# Patient Record
Sex: Male | Born: 1995 | Race: Black or African American | Hispanic: No | Marital: Single | Smoking: Never smoker
Health system: Southern US, Community
[De-identification: ages and names within clinical notes are randomized; demographics above are authoritative.]

---

## 2018-02-05 ENCOUNTER — Other Ambulatory Visit: Payer: Self-pay

## 2018-02-05 ENCOUNTER — Emergency Department (HOSPITAL_COMMUNITY)
Admission: EM | Admit: 2018-02-05 | Discharge: 2018-02-05 | Disposition: A | Discharge status: Home / Self Care | Payer: Self-pay | Attending: Physician Assistant | Admitting: Physician Assistant

## 2018-02-05 ENCOUNTER — Encounter (HOSPITAL_COMMUNITY): Payer: Self-pay

## 2018-02-05 DIAGNOSIS — H109 Unspecified conjunctivitis: Secondary | ICD-10-CM

## 2018-02-05 DIAGNOSIS — H1032 Unspecified acute conjunctivitis, left eye: Secondary | ICD-10-CM | POA: Insufficient documentation

## 2018-02-05 MED ORDER — POLYMYXIN B-TRIMETHOPRIM 10000-0.1 UNIT/ML-% OP SOLN: 1.0000 [drp] | OPHTHALMIC | 0 refills | Status: AC

## 2018-02-05 MED ORDER — POLYMYXIN B-TRIMETHOPRIM 10000-0.1 UNIT/ML-% OP SOLN
2.0000 [drp] | Freq: Once | OPHTHALMIC | Status: DC
  Filled 2018-02-05: qty 10

## 2018-02-05 NOTE — Discharge Instructions (Addendum)
°  Follow with the eye doctor in the next 24 to 28 hours ° °Do not reuse your contact lenses and do not use any contact lenses until you are cleared by the eye doctor. ° ° Wash your hands frequently and try to keep your hands away from the affected eye(s).  ° °You should be feeling some improvement by 48 hours. If symptoms worsen, you develop pain, change in your vision or no improvement in 48 hours please follow with the ophthalmologist or, if that is not possible, return to the emergency room for a recheck. ° ° °

## 2018-02-05 NOTE — ED Provider Notes (Signed)
Patient placed in Quick Look pathway, seen and evaluated   Chief Complaint: eye irritation  HPI:   Ricardo Watts is a 22 y.o. male who presents to the ED with eye itching and redness that started a few days ago and has gotten worse. Patient reports his girlfriend had pink eye and then he got it in his left eye. He reports his eyes itched and he rubbed both of them and now both eye are red and itching.   ROS: Eyes: redness, swelling, itching  Physical Exam:  BP 126/82 (BP Location: Right Arm)   Pulse 81   Temp 98 F (36.7 C) (Oral)   Resp 18   SpO2 100%    Gen: No distress  Neuro: Awake and Alert  Skin: Warm and dry  HEENT: bilateral eye redness left >right     Focused Exam:    Initiation of care has begun. The patient has been counseled on the process, plan, and necessity for staying for the completion/evaluation, and the remainder of the medical screening examination    Janne Napoleoneese, Frederic Tones M, NP 02/05/18 1739    Abelino DerrickMackuen, Courteney Lyn, MD 02/06/18 1929

## 2018-02-05 NOTE — ED Triage Notes (Signed)
Pt reports girl friend was told she had pink eye several days ago and he now has red sclera and drainage.

## 2018-02-05 NOTE — ED Notes (Signed)
ED Provider at bedside. 

## 2018-02-05 NOTE — ED Provider Notes (Signed)
[facility] Provider Note   CSN: [id number] Arrival date & time: 02/05/18  1656     History   Chief Complaint Chief Complaint  Patient presents with  . Conjunctivitis    HPI   Blood pressure 126/82, pulse 81, temperature 98 F (36.7 C), temperature source Oral, resp. rate 18, SpO2 100 %.  Ricardo Watts is a 22 y.o. male complaining of irritation and redness to left eye onset this morning.  He is not a contact lens wearer, there is no trauma, his girlfriend is sick with pinkeye she started treatment yesterday.  He denies any significant decrease in visual acuity, pain, significant discharge she has had some scant matting of the eyelashes when he woke and some clear tearing.  History reviewed. No pertinent past medical history.  There are no active problems to display for this patient.   History reviewed. No pertinent surgical history.      Home Medications    Prior to Admission medications   Medication Sig Start Date End Date Taking? Authorizing Provider  trimethoprim-polymyxin b (POLYTRIM) ophthalmic solution Place 1 drop into the right eye every 4 (four) hours. 02/05/18   Ayla Dunigan, Mardella LaymanNicole, PA-C    Family History No family history on file.  Social History Social History   Tobacco Use  . Smoking status: Never Smoker  . Smokeless tobacco: Never Used  Substance Use Topics  . Alcohol use: Never    Frequency: Never  . Drug use: Never     Allergies   Patient has no known allergies.   Review of Systems Review of Systems  A complete review of systems was obtained and all systems are negative except as noted in the HPI and PMH.   Physical Exam Updated Vital Signs BP 126/82 (BP Location: Right Arm)   Pulse 81   Temp 98 F (36.7 C) (Oral)   Resp 18   SpO2 100%   Physical Exam  Constitutional: He is oriented to person, place, and time. He appears well-developed and well-nourished. No distress.  HENT:    Head: Normocephalic and atraumatic.  Mouth/Throat: Oropharynx is clear and moist.  Eyes: Pupils are equal, round, and reactive to light. EOM are normal.  Left eye injected, pupils equal round and reactive to light  Neck: Normal range of motion.  Cardiovascular: Normal rate, regular rhythm and intact distal pulses.  Pulmonary/Chest: Effort normal and breath sounds normal.  Abdominal: Soft. There is no tenderness.  Musculoskeletal: Normal range of motion.  Neurological: He is alert and oriented to person, place, and time.  Skin: He is not diaphoretic.  Psychiatric: He has a normal mood and affect.  Nursing note and vitals reviewed.    ED Treatments / Results  Labs (all labs ordered are listed, but only abnormal results are displayed) Labs Reviewed - No data to display  EKG None  Radiology No results found.  Procedures Procedures (including critical care time)  Medications Ordered in ED Medications  trimethoprim-polymyxin b (POLYTRIM) ophthalmic solution 2 drop (has no administration in time range)     Initial Impression / Assessment and Plan / ED Course  I have reviewed the triage vital signs and the nursing notes.  Pertinent labs & imaging results that were available during my care of the patient were reviewed by me and considered in my medical decision making (see chart for details).     Vitals:   02/05/18 1727  BP: 126/82  Pulse: 81  Resp: 18  Temp: 98  F (36.7 C)  TempSrc: Oral  SpO2: 100%    Medications  trimethoprim-polymyxin b (POLYTRIM) ophthalmic solution 2 drop (has no administration in time range)    Jerzy Ollis Daudelin is 22 y.o. male presenting with redness and tearing onset this morning, positive sick contact, patient is not a contact lens wearer, patient will be started on Polytrim, advised technique to prevent the spread of infection.  Evaluation does not show pathology that would require ongoing emergent intervention or inpatient  treatment. Pt is hemodynamically stable and mentating appropriately. Discussed findings and plan with patient/guardian, who agrees with care plan. All questions answered. Return precautions discussed and outpatient follow up given.      Final Clinical Impressions(s) / ED Diagnoses   Final diagnoses:  Conjunctivitis of left eye, unspecified conjunctivitis type    ED Discharge Orders        Ordered    trimethoprim-polymyxin b (POLYTRIM) ophthalmic solution  Every 4 hours     02/05/18 1811       Chayil Gantt, Mardella Layman 02/05/18 1815    Mackuen, Cindee Salt, MD 02/06/18 1928

## 2018-02-07 ENCOUNTER — Encounter (HOSPITAL_COMMUNITY): Payer: Self-pay | Admitting: Emergency Medicine

## 2018-02-07 ENCOUNTER — Emergency Department (HOSPITAL_COMMUNITY)
Admission: EM | Admit: 2018-02-07 | Discharge: 2018-02-08 | Disposition: A | Discharge status: Home / Self Care | Payer: Self-pay | Attending: Emergency Medicine | Admitting: Emergency Medicine

## 2018-02-07 ENCOUNTER — Other Ambulatory Visit: Payer: Self-pay

## 2018-02-07 DIAGNOSIS — H1013 Acute atopic conjunctivitis, bilateral: Secondary | ICD-10-CM | POA: Insufficient documentation

## 2018-02-07 NOTE — ED Triage Notes (Signed)
Pt st's he was seen and tx here 2 days ago for pink eye.  Pt st's eyes are not getting any better

## 2018-02-08 MED ORDER — OLOPATADINE HCL 0.1 % OP SOLN
1.0000 [drp] | Freq: Two times a day (BID) | OPHTHALMIC | Status: DC
  Administered 2018-02-08: 1 [drp] via OPHTHALMIC
  Filled 2018-02-08: qty 5

## 2018-02-08 NOTE — ED Provider Notes (Signed)
[facility]   CSN: [id number] Arrival date & time: 02/07/18  2205     History   Chief Complaint Chief Complaint  Patient presents with  . Conjunctivitis    HPI Ricardo Watts is a 22 y.o. male.  Patient returns to the ED for evaluation of worsening bilateral eye swelling and discomfort. He was seen 2 days ago for same and given Polytrim ophthalmic which he states he has been using as directed. His swelling and tearing (clear drainage) has been worse and his eye lids have begun to swell. No nasal/sinus congestion, sore throat. He feels his vision is blurry but attributes that to excessive clear drainage. He does not use contacts. No known contaminants.   The history is provided by the patient. No language interpreter was used.    History reviewed. No pertinent past medical history.  There are no active problems to display for this patient.   History reviewed. No pertinent surgical history.      Home Medications    Prior to Admission medications   Medication Sig Start Date End Date Taking? Authorizing Provider  trimethoprim-polymyxin b (POLYTRIM) ophthalmic solution Place 1 drop into the right eye every 4 (four) hours. 02/05/18   Pisciotta, Mardella LaymanNicole, PA-C    Family History No family history on file.  Social History Social History   Tobacco Use  . Smoking status: Never Smoker  . Smokeless tobacco: Never Used  Substance Use Topics  . Alcohol use: Never    Frequency: Never  . Drug use: Never     Allergies   Patient has no known allergies.   Review of Systems Review of Systems  HENT: Negative.  Negative for congestion, rhinorrhea, sinus pain and sneezing.   Eyes: Positive for pain, discharge, redness, itching and visual disturbance. Negative for photophobia.       See HPI.  Gastrointestinal: Negative for nausea.  Allergic/Immunologic: Negative for environmental allergies.  Neurological: Negative  for headaches.     Physical Exam Updated Vital Signs BP 115/73 (BP Location: Right Arm)   Pulse (!) 57   Temp 97.8 F (36.6 C) (Oral)   Resp 18   Ht 6\' 3"  (1.905 m)   Wt 83.9 kg (185 lb)   SpO2 99%   BMI 23.12 kg/m   Physical Exam  Constitutional: He is oriented to person, place, and time. He appears well-developed and well-nourished.  Eyes:  Bilateral eye lid swelling of upper > lower lids without significant erythema. No periorbital or facial swelling. There is significant conjunctival erythema and swelling with chemosis. Cornea are clear. PERRL. No foreign body visualized. There is clear drainage without purulence.  Neck: Normal range of motion.  Pulmonary/Chest: Effort normal.  Musculoskeletal: Normal range of motion.  Neurological: He is alert and oriented to person, place, and time.  Skin: Skin is warm and dry.  Psychiatric: He has a normal mood and affect.     ED Treatments / Results  Labs (all labs ordered are listed, but only abnormal results are displayed) Labs Reviewed - No data to display  EKG None  Radiology No results found.  Procedures Procedures (including critical care time)  Medications Ordered in ED Medications  olopatadine (PATANOL) 0.1 % ophthalmic solution 1 drop (has no administration in time range)     Initial Impression / Assessment and Plan / ED Course  I have reviewed the triage vital signs and the nursing notes.  Pertinent labs & imaging results that were available  during my care of the patient were reviewed by me and considered in my medical decision making (see chart for details).     Patient returns to the ED with worsening symptoms of conjunctival swelling and discomfort. Has been using abx drops without improvement.   He has significant chemosis supporting allergic cause of symptoms. Will provide Patanol for daily use and strongly encourage follow up with ophthalmology for recheck to insure improvement/resolution.  Final  Clinical Impressions(s) / ED Diagnoses   Final diagnoses:  None   1. Allergic conjunctivitis  ED Discharge Orders    None       Elpidio Anis, PA-C 02/08/18 5366    Ward, Layla Maw, DO 02/08/18 4403

## 2018-02-08 NOTE — Discharge Instructions (Addendum)
Use Patanol twice daily. Apply cool compresses to the eye to provide comfort and reduce swelling. Also recommend Artificial Tears for further symptomatic relief. Keep this in the refrigerator as the cooler temperature will be beneficial. Call Dr. Charlaine DaltonMarchese on Monday to schedule an appointment for recheck to insure symptoms are improving.

## 2020-03-30 ENCOUNTER — Emergency Department (HOSPITAL_COMMUNITY): Payer: Self-pay

## 2020-03-30 ENCOUNTER — Other Ambulatory Visit: Payer: Self-pay

## 2020-03-30 ENCOUNTER — Emergency Department (HOSPITAL_COMMUNITY)
Admission: EM | Admit: 2020-03-30 | Discharge: 2020-03-31 | Disposition: A | Discharge status: Home / Self Care | Payer: Self-pay | Attending: Emergency Medicine | Admitting: Emergency Medicine

## 2020-03-30 DIAGNOSIS — R197 Diarrhea, unspecified: Secondary | ICD-10-CM | POA: Insufficient documentation

## 2020-03-30 DIAGNOSIS — R531 Weakness: Secondary | ICD-10-CM | POA: Insufficient documentation

## 2020-03-30 DIAGNOSIS — Z20822 Contact with and (suspected) exposure to covid-19: Secondary | ICD-10-CM | POA: Insufficient documentation

## 2020-03-30 DIAGNOSIS — R519 Headache, unspecified: Secondary | ICD-10-CM | POA: Insufficient documentation

## 2020-03-30 DIAGNOSIS — R05 Cough: Secondary | ICD-10-CM | POA: Insufficient documentation

## 2020-03-30 DIAGNOSIS — M7918 Myalgia, other site: Secondary | ICD-10-CM | POA: Insufficient documentation

## 2020-03-30 DIAGNOSIS — R110 Nausea: Secondary | ICD-10-CM | POA: Insufficient documentation

## 2020-03-30 DIAGNOSIS — R509 Fever, unspecified: Secondary | ICD-10-CM | POA: Insufficient documentation

## 2020-03-30 LAB — CBC WITH DIFFERENTIAL/PLATELET
Abs Immature Granulocytes: 0 10*3/uL (ref 0.00–0.07)
Basophils Absolute: 0 10*3/uL (ref 0.0–0.1)
Basophils Relative: 1 %
Eosinophils Absolute: 0 10*3/uL (ref 0.0–0.5)
Eosinophils Relative: 1 %
HCT: 41.4 % (ref 39.0–52.0)
Hemoglobin: 14.8 g/dL (ref 13.0–17.0)
Lymphocytes Relative: 44 %
Lymphs Abs: 1.3 10*3/uL (ref 0.7–4.0)
MCH: 29 pg (ref 26.0–34.0)
MCHC: 35.7 g/dL (ref 30.0–36.0)
MCV: 81 fL (ref 80.0–100.0)
Monocytes Absolute: 0.3 10*3/uL (ref 0.1–1.0)
Monocytes Relative: 10 %
Neutro Abs: 1.3 10*3/uL — ABNORMAL LOW (ref 1.7–7.7)
Neutrophils Relative %: 44 %
Platelets: 128 10*3/uL — ABNORMAL LOW (ref 150–400)
RBC: 5.11 MIL/uL (ref 4.22–5.81)
RDW: 13.2 % (ref 11.5–15.5)
WBC: 3 10*3/uL — ABNORMAL LOW (ref 4.0–10.5)
nRBC: 0 % (ref 0.0–0.2)
nRBC: 0 /100 WBC

## 2020-03-30 LAB — URINALYSIS, ROUTINE W REFLEX MICROSCOPIC
Bacteria, UA: NONE SEEN
Bilirubin Urine: NEGATIVE
Glucose, UA: NEGATIVE mg/dL
Ketones, ur: NEGATIVE mg/dL
Leukocytes,Ua: NEGATIVE
Nitrite: NEGATIVE
Protein, ur: NEGATIVE mg/dL
Specific Gravity, Urine: 1.013 (ref 1.005–1.030)
pH: 7 (ref 5.0–8.0)

## 2020-03-30 LAB — COMPREHENSIVE METABOLIC PANEL
ALT: 223 U/L — ABNORMAL HIGH (ref 0–44)
AST: 256 U/L — ABNORMAL HIGH (ref 15–41)
Albumin: 3.7 g/dL (ref 3.5–5.0)
Alkaline Phosphatase: 126 U/L (ref 38–126)
Anion gap: 11 (ref 5–15)
BUN: 8 mg/dL (ref 6–20)
CO2: 27 mmol/L (ref 22–32)
Calcium: 8.7 mg/dL — ABNORMAL LOW (ref 8.9–10.3)
Chloride: 98 mmol/L (ref 98–111)
Creatinine, Ser: 1.07 mg/dL (ref 0.61–1.24)
GFR calc Af Amer: >60 mL/min (ref >60)
GFR calc non Af Amer: >60 mL/min (ref >60)
Glucose, Bld: 101 mg/dL — ABNORMAL HIGH (ref 70–99)
Potassium: 4.2 mmol/L (ref 3.5–5.1)
Sodium: 136 mmol/L (ref 135–145)
Total Bilirubin: 1.9 mg/dL — ABNORMAL HIGH (ref 0.3–1.2)
Total Protein: 7.1 g/dL (ref 6.5–8.1)

## 2020-03-30 LAB — LACTIC ACID, PLASMA: Lactic Acid, Venous: 1.4 mmol/L (ref 0.5–1.9)

## 2020-03-30 NOTE — ED Triage Notes (Signed)
Patient arrives with c/o of intermittent fever, nausea, and muscle aches x4 days. Per the patient, his fever reached a high of 103 on Monday, causing him to be out of work. He had a covid test this week due to his symptoms that was negative. Patient has used tylenol and dramamine at home with no relief.

## 2020-03-31 ENCOUNTER — Emergency Department (HOSPITAL_COMMUNITY): Payer: Self-pay

## 2020-03-31 LAB — SARS CORONAVIRUS 2 BY RT PCR (HOSPITAL ORDER, PERFORMED IN ~~LOC~~ HOSPITAL LAB): SARS Coronavirus 2: NEGATIVE

## 2020-03-31 LAB — LACTIC ACID, PLASMA: Lactic Acid, Venous: 2.3 mmol/L (ref 0.5–1.9)

## 2020-03-31 MED ORDER — BENZONATATE 100 MG PO CAPS: 100.0000 mg | ORAL_CAPSULE | Freq: Three times a day (TID) | ORAL | 0 refills | Status: DC | PRN

## 2020-03-31 MED ORDER — ONDANSETRON HCL 4 MG PO TABS: 4.0000 mg | ORAL_TABLET | Freq: Three times a day (TID) | ORAL | 0 refills | Status: AC | PRN

## 2020-03-31 MED ORDER — SODIUM CHLORIDE 0.9 % IV BOLUS
500.0000 mL | Freq: Once | INTRAVENOUS | Status: AC
  Administered 2020-03-31: 500 mL via INTRAVENOUS

## 2020-03-31 MED ORDER — ONDANSETRON HCL 4 MG/2ML IJ SOLN
4.0000 mg | Freq: Once | INTRAMUSCULAR | Status: AC
  Administered 2020-03-31: 4 mg via INTRAVENOUS
  Filled 2020-03-31: qty 2

## 2020-03-31 MED ORDER — ONDANSETRON HCL 4 MG PO TABS: 4.0000 mg | ORAL_TABLET | Freq: Three times a day (TID) | ORAL | 0 refills | Status: DC | PRN

## 2020-03-31 MED ORDER — BENZONATATE 100 MG PO CAPS: 100.0000 mg | ORAL_CAPSULE | Freq: Three times a day (TID) | ORAL | 0 refills | Status: AC | PRN

## 2020-03-31 MED ORDER — IBUPROFEN 800 MG PO TABS
800.0000 mg | ORAL_TABLET | Freq: Once | ORAL | Status: AC
  Administered 2020-03-31: 800 mg via ORAL
  Filled 2020-03-31: qty 1

## 2020-03-31 NOTE — ED Provider Notes (Signed)
[facility] EMERGENCY DEPARTMENT Provider Note   CSN: [id number] Arrival date & time: 03/30/20  1826     History Chief Complaint  Patient presents with  . Fever  . Weakness    Ricardo Watts is a 24 y.o. male with a hx of no major medical problems presents to the Emergency Department complaining of gradual, persistent, progressively worsening fever and chills onset 4 days ago.  Patient reports associated headache, body aches, nausea and diarrhea.  Patient reports he has been drinking fluids but continues to feel somewhat dehydrated.  Reports intermittent cough but no shortness of breath or chest pain.  No known Covid contacts.  Patient has not been vaccinated.  No treatments prior to arrival.  Patient found to be febrile on arrival.  The history is provided by the patient and medical records. No language interpreter was used.       No past medical history on file.  There are no problems to display for this patient.   No past surgical history on file.     No family history on file.  Social History   Tobacco Use  . Smoking status: Never Smoker  . Smokeless tobacco: Never Used  Substance Use Topics  . Alcohol use: Never  . Drug use: Never    Home Medications Prior to Admission medications   Medication Sig Start Date End Date Taking? Authorizing Provider  benzonatate (TESSALON PERLES) 100 MG capsule Take 1 capsule (100 mg total) by mouth 3 (three) times daily as needed for cough (cough). 03/31/20   Olia Hinderliter, Dahlia Client, PA-C  ondansetron (ZOFRAN) 4 MG tablet Take 1 tablet (4 mg total) by mouth every 8 (eight) hours as needed for nausea or vomiting. 03/31/20   Shawndrea Rutkowski, Dahlia Client, PA-C  trimethoprim-polymyxin b (POLYTRIM) ophthalmic solution Place 1 drop into the right eye every 4 (four) hours. 02/05/18   Pisciotta, Joni Reining, PA-C    Allergies    Patient has no known allergies.  Review of Systems   Review of Systems  Constitutional: Positive  for chills, fatigue and fever. Negative for appetite change, diaphoresis and unexpected weight change.  HENT: Positive for congestion. Negative for mouth sores.   Eyes: Negative for visual disturbance.  Respiratory: Positive for cough. Negative for chest tightness, shortness of breath and wheezing.   Cardiovascular: Negative for chest pain.  Gastrointestinal: Positive for diarrhea and nausea. Negative for abdominal pain, constipation and vomiting.  Endocrine: Negative for polydipsia, polyphagia and polyuria.  Genitourinary: Negative for dysuria, frequency, hematuria and urgency.  Musculoskeletal: Negative for back pain and neck stiffness.  Skin: Negative for rash.  Allergic/Immunologic: Negative for immunocompromised state.  Neurological: Positive for weakness and headaches. Negative for syncope and light-headedness.  Hematological: Does not bruise/bleed easily.  Psychiatric/Behavioral: Negative for sleep disturbance. The patient is not nervous/anxious.     Physical Exam Updated Vital Signs BP 124/85 (BP Location: Right Arm)   Pulse 85   Temp 99.6 F (37.6 C) (Oral)   Resp 18   Ht 6\' 5"  (1.956 m)   Wt 86.2 kg   SpO2 100%   BMI 22.53 kg/m   Physical Exam Vitals and nursing note reviewed.  Constitutional:      General: He is not in acute distress.    Appearance: He is ill-appearing. He is not diaphoretic.  HENT:     Head: Normocephalic.     Nose: Congestion present.     Mouth/Throat:     Mouth: Mucous membranes are moist.  Eyes:  General: No scleral icterus.    Conjunctiva/sclera: Conjunctivae normal.  Cardiovascular:     Rate and Rhythm: Normal rate and regular rhythm.     Pulses: Normal pulses.          Radial pulses are 2+ on the right side and 2+ on the left side.  Pulmonary:     Effort: No tachypnea, accessory muscle usage, prolonged expiration, respiratory distress or retractions.     Breath sounds: Normal breath sounds. No stridor. No wheezing or rhonchi.      Comments: Equal chest rise. No increased work of breathing. Abdominal:     General: There is no distension.     Palpations: Abdomen is soft.     Tenderness: There is no abdominal tenderness. There is no guarding or rebound.  Musculoskeletal:     Cervical back: Normal range of motion.     Comments: Moves all extremities equally and without difficulty.  Skin:    General: Skin is warm and dry.     Capillary Refill: Capillary refill takes less than 2 seconds.  Neurological:     Mental Status: He is alert.     GCS: GCS eye subscore is 4. GCS verbal subscore is 5. GCS motor subscore is 6.     Comments: Speech is clear and goal oriented.  Psychiatric:        Mood and Affect: Mood normal.     ED Results / Procedures / Treatments   Labs (all labs ordered are listed, but only abnormal results are displayed) Labs Reviewed  LACTIC ACID, PLASMA - Abnormal; Notable for the following components:      Result Value   Lactic Acid, Venous 2.3 (*)    All other components within normal limits  COMPREHENSIVE METABOLIC PANEL - Abnormal; Notable for the following components:   Glucose, Bld 101 (*)    Calcium 8.7 (*)    AST 256 (*)    ALT 223 (*)    Total Bilirubin 1.9 (*)    All other components within normal limits  CBC WITH DIFFERENTIAL/PLATELET - Abnormal; Notable for the following components:   WBC 3.0 (*)    Platelets 128 (*)    Neutro Abs 1.3 (*)    All other components within normal limits  URINALYSIS, ROUTINE W REFLEX MICROSCOPIC - Abnormal; Notable for the following components:   Color, Urine AMBER (*)    Hgb urine dipstick SMALL (*)    All other components within normal limits  SARS CORONAVIRUS 2 BY RT PCR (HOSPITAL ORDER, PERFORMED IN [facility])  LACTIC ACID, PLASMA  POC SARS CORONAVIRUS 2 AG -  ED    Radiology DG Chest 2 View  Result Date: 03/30/2020 CLINICAL DATA:  Muscle aches and weakness. EXAM: CHEST - 2 VIEW COMPARISON:  None. FINDINGS: The heart size and  mediastinal contours are within normal limits. Both lungs are clear. The visualized skeletal structures are unremarkable. IMPRESSION: No active cardiopulmonary disease. Electronically Signed   By: Katherine Mantle M.D.   On: 03/30/2020 20:16   US Abdomen Limited  Result Date: 03/31/2020 CLINICAL DATA:  Right upper quadrant abdominal pain. EXAM: ULTRASOUND ABDOMEN LIMITED RIGHT UPPER QUADRANT COMPARISON:  None. FINDINGS: Gallbladder: No gallstones or wall thickening visualized. No sonographic Murphy sign noted by sonographer. Common bile duct: Diameter: 3 mm. Liver: No focal lesion identified. Within normal limits in parenchymal echogenicity. Portal vein is patent on color Doppler imaging with normal direction of blood flow towards the liver. Other: None. IMPRESSION: Normal  study. Electronically Signed   By: Constance Holster M.D.   On: 03/31/2020 00:45    Procedures Procedures (including critical care time)  Medications Ordered in ED Medications  sodium chloride 0.9 % bolus 500 mL (0 mLs Intravenous Stopped 03/31/20 0351)  ibuprofen (ADVIL) tablet 800 mg (800 mg Oral Given 03/31/20 0100)  ondansetron (ZOFRAN) injection 4 mg (4 mg Intravenous Given 03/31/20 0054)    ED Course  I have reviewed the triage vital signs and the nursing notes.  Pertinent labs & imaging results that were available during my care of the patient were reviewed by me and considered in my medical decision making (see chart for details).  Clinical Course as of Mar 31 401  Fri Mar 31, 2020  0401 Elevated.  Fluids given.  Lactic Acid, Venous(!!): 2.3 [HM]  0401 Noted.  No large amount of Tylenol ingestion.  Suspect Covid hepatitis.  AST(!): 256 [HM]    Clinical Course User Index [HM] Terris Bodin, Gwenlyn Perking   MDM Rules/Calculators/A&P                       Lowella Dandy Quanah Majka was evaluated in Emergency Department on 03/31/2020 for the symptoms described in the history of present illness. He was evaluated in  the context of the global COVID-19 pandemic, which necessitated consideration that the patient might be at risk for infection with the SARS-CoV-2 virus that causes COVID-19. Institutional protocols and algorithms that pertain to the evaluation of patients at risk for COVID-19 are in a state of rapid change based on information released by regulatory bodies including the CDC and federal and state organizations. These policies and algorithms were followed during the patient's care in the ED.   Patient presents with Covid-like symptoms.  Covid test pending. Labs consistent with COVID, including elevation of LFTs and leukopenia.  Abdomen is soft and nontender.  Will ultrasound gallbladder, give fluids and reassess.  4:00 AM Patient reports he is feeling better.  No tachycardia, hypotension or fever here in the emergency department.  Labs reviewed.  He does have elevation in LFTs however ultrasound shows no evidence of cholecystitis or choledocholithiasis.  Patient reports minimal Tylenol ingestion in the last 2 weeks (no more than 2 extra strength tablets per day), well under the toxic limit.  Given lab abnormalities and symptoms patient clinically has COVID-19.  Will treat symptomatically.  Discussed lab abnormalities with patient and mother on the phone.  Recommend close follow-up.  Also discussed reasons to return immediately to the emergency department.  Patient states understanding and is in agreement with the plan.    Final Clinical Impression(s) / ED Diagnoses Final diagnoses:  Suspected COVID-19 virus infection    Rx / DC Orders ED Discharge Orders         Ordered    benzonatate (TESSALON PERLES) 100 MG capsule  3 times daily PRN,   Status:  Discontinued     03/31/20 0341    ondansetron (ZOFRAN) 4 MG tablet  Every 8 hours PRN,   Status:  Discontinued     03/31/20 0341    ondansetron (ZOFRAN) 4 MG tablet  Every 8 hours PRN     03/31/20 0356    benzonatate (TESSALON PERLES) 100 MG capsule   3 times daily PRN     03/31/20 0356           Senetra Dillin, Gwenlyn Perking 03/31/20 0402    Palumbo, April, MD 03/31/20 5093

## 2020-03-31 NOTE — Discharge Instructions (Signed)
1. Medications: Alternate tylenol and ibuprofen for fever control, P.o. for coughing, Zofran for nausea and diarrhea, continue usual home medications 2. Treatment: rest, drink plenty of fluids, isolate for the next 10 days 3. Follow Up: Please followup with your primary doctor if your symptoms are not improving after 10-14 days; Please return to the ER for high fevers, persistent vomiting, shortness of breath or other concerns.

## 2021-02-12 ENCOUNTER — Other Ambulatory Visit: Payer: Self-pay

## 2021-02-12 ENCOUNTER — Emergency Department (HOSPITAL_COMMUNITY)
Admission: EM | Admit: 2021-02-12 | Discharge: 2021-02-12 | Disposition: A | Discharge status: Home / Self Care | Payer: Worker's Compensation | Attending: Emergency Medicine | Admitting: Emergency Medicine

## 2021-02-12 ENCOUNTER — Emergency Department (HOSPITAL_COMMUNITY): Payer: Worker's Compensation

## 2021-02-12 DIAGNOSIS — Y990 Civilian activity done for income or pay: Secondary | ICD-10-CM | POA: Diagnosis not present

## 2021-02-12 DIAGNOSIS — X500XXA Overexertion from strenuous movement or load, initial encounter: Secondary | ICD-10-CM | POA: Insufficient documentation

## 2021-02-12 DIAGNOSIS — S4992XA Unspecified injury of left shoulder and upper arm, initial encounter: Secondary | ICD-10-CM | POA: Diagnosis present

## 2021-02-12 DIAGNOSIS — M62838 Other muscle spasm: Secondary | ICD-10-CM

## 2021-02-12 DIAGNOSIS — M25512 Pain in left shoulder: Secondary | ICD-10-CM

## 2021-02-12 MED ORDER — NAPROXEN 250 MG PO TABS
500.0000 mg | ORAL_TABLET | Freq: Once | ORAL | Status: AC
  Administered 2021-02-12: 500 mg via ORAL
  Filled 2021-02-12: qty 2

## 2021-02-12 MED ORDER — NAPROXEN 500 MG PO TABS: 500.0000 mg | ORAL_TABLET | Freq: Two times a day (BID) | ORAL | 0 refills | Status: AC

## 2021-02-12 MED ORDER — CYCLOBENZAPRINE HCL 10 MG PO TABS: 10.0000 mg | ORAL_TABLET | Freq: Two times a day (BID) | ORAL | 0 refills | Status: AC | PRN

## 2021-02-12 MED ORDER — CYCLOBENZAPRINE HCL 10 MG PO TABS
5.0000 mg | ORAL_TABLET | Freq: Once | ORAL | Status: AC
  Administered 2021-02-12: 5 mg via ORAL
  Filled 2021-02-12: qty 1

## 2021-02-12 NOTE — ED Provider Notes (Signed)
[facility] EMERGENCY DEPARTMENT Provider Note   CSN: [id number] Arrival date & time: 02/12/21  1755       History Chief Complaint  Patient presents with  . Shoulder Injury    Ricardo Watts is a 25 y.o. male.  HPI    This is a 25 year old male with no reported past medical history who presents with a shoulder injury.  Patient reports that he was at work when he lifted a carpet and placed onto his left shoulder.  He states that he heard a pop.  He has had pain in the posterior shoulder since that time.  Pain is worse with range of motion.  He rates his pain 8 out of 10.  He has not taken anything for his pain.  Denies numbness or tingling in the arm.  Patient reports that he is able to range his arm but it is quite painful for him.  Denies other injury. No past medical history on file.  There are no problems to display for this patient.   No past surgical history on file.     No family history on file.  Social History   Tobacco Use  . Smoking status: Never Smoker  . Smokeless tobacco: Never Used  Substance Use Topics  . Alcohol use: Never  . Drug use: Never    Home Medications Prior to Admission medications   Medication Sig Start Date End Date Taking? Authorizing Provider  cyclobenzaprine (FLEXERIL) 10 MG tablet Take 1 tablet (10 mg total) by mouth 2 (two) times daily as needed for muscle spasms. 02/12/21  Yes Tyberius Ryner, Mayer Masker, MD  naproxen (NAPROSYN) 500 MG tablet Take 1 tablet (500 mg total) by mouth 2 (two) times daily. 02/12/21  Yes Dimitra Woodstock, Mayer Masker, MD  benzonatate (TESSALON PERLES) 100 MG capsule Take 1 capsule (100 mg total) by mouth 3 (three) times daily as needed for cough (cough). 03/31/20   Muthersbaugh, Dahlia Client, PA-C  ondansetron (ZOFRAN) 4 MG tablet Take 1 tablet (4 mg total) by mouth every 8 (eight) hours as needed for nausea or vomiting. 03/31/20   Muthersbaugh, Dahlia Client, PA-C  trimethoprim-polymyxin b (POLYTRIM) ophthalmic solution  Place 1 drop into the right eye every 4 (four) hours. 02/05/18   Pisciotta, Joni Reining, PA-C    Allergies    Patient has no known allergies.  Review of Systems   Review of Systems  Constitutional: Negative for fever.  Musculoskeletal:       Total pain  Neurological: Negative for weakness and numbness.  All other systems reviewed and are negative.   Physical Exam Updated Vital Signs BP 123/80 (BP Location: Right Arm)   Pulse 74   Temp 98.4 F (36.9 C) (Oral)   Resp 20   SpO2 100%   Physical Exam Vitals and nursing note reviewed.  Constitutional:      Appearance: He is well-developed. He is not ill-appearing.  HENT:     Head: Normocephalic and atraumatic.     Mouth/Throat:     Mouth: Mucous membranes are moist.  Eyes:     Pupils: Pupils are equal, round, and reactive to light.  Cardiovascular:     Rate and Rhythm: Normal rate and regular rhythm.  Pulmonary:     Effort: Pulmonary effort is normal. No respiratory distress.  Abdominal:     Palpations: Abdomen is soft.     Tenderness: There is no abdominal tenderness.  Musculoskeletal:     Cervical back: Neck supple.     Comments: Tenderness  to palpation over the posterior musculature of the left shoulder along the trap, slight spasm noted, normal range of motion of the joint, no clavicular or AC joint tenderness or deformity, 2+ radial pulse, strength appears grossly intact with abduction, external and internal rotation of the shoulder  Lymphadenopathy:     Cervical: No cervical adenopathy.  Skin:    General: Skin is warm and dry.  Neurological:     Mental Status: He is alert and oriented to person, place, and time.  Psychiatric:        Mood and Affect: Mood normal.     ED Results / Procedures / Treatments   Labs (all labs ordered are listed, but only abnormal results are displayed) Labs Reviewed - No data to display  EKG None  Radiology DG Shoulder Left  Result Date: 02/12/2021 CLINICAL DATA:  25 year old male  with trauma to the left shoulder. EXAM: LEFT SHOULDER - 2+ VIEW COMPARISON:  None. FINDINGS: There is no evidence of fracture or dislocation. There is no evidence of arthropathy or other focal bone abnormality. Soft tissues are unremarkable. IMPRESSION: Negative. Electronically Signed   By: Elgie Collard M.D.   On: 02/12/2021 18:39    Procedures Procedures   Medications Ordered in ED Medications  cyclobenzaprine (FLEXERIL) tablet 5 mg (has no administration in time range)  naproxen (NAPROSYN) tablet 500 mg (has no administration in time range)    ED Course  I have reviewed the triage vital signs and the nursing notes.  Pertinent labs & imaging results that were available during my care of the patient were reviewed by me and considered in my medical decision making (see chart for details).    MDM Rules/Calculators/A&P                          Patient presents with left shoulder pain.  He is overall nontoxic vital signs are reassuring.  X-ray reviewed from triage and is negative for acute fracture or dislocation.  Pain is actually the posterior portion of the shoulder along the musculature and trapezius.  Strength is intact.  Suspect muscle spasm or strain.  Rotator cuff injury is a consideration although mechanism would not necessarily be consistent.  Recommend ice, anti-inflammatories, avoiding heavy lifting until improved.  He will be given orthopedic follow-up.  Patient is agreeable plan.  After history, exam, and medical workup I feel the patient has been appropriately medically screened and is safe for discharge home. Pertinent diagnoses were discussed with the patient. Patient was given return precautions.  Final Clinical Impression(s) / ED Diagnoses Final diagnoses:  Acute pain of left shoulder  Muscle spasm    Rx / DC Orders ED Discharge Orders         Ordered    cyclobenzaprine (FLEXERIL) 10 MG tablet  2 times daily PRN        02/12/21 2334    naproxen (NAPROSYN) 500 MG  tablet  2 times daily        02/12/21 2334           Shon Baton, MD 02/12/21 2339

## 2021-02-12 NOTE — ED Triage Notes (Signed)
Pt from work for eval of shoulder injury-set a rug to be cleaned on his L shoulder, heard a pop, and now has no mobility of that joint.

## 2021-02-12 NOTE — ED Triage Notes (Signed)
Emergency Medicine Provider Triage Evaluation Note  Jaxtin Raimondo , a 25 y.o. male  was evaluated in triage.  Pt complains of left shoulder pain.  He states that he was at work picking up a rug when he rested it on his left shoulder and heard a pop.  He states that he has decreased subjective sensation in the left upper arm.  He denies true weakness however notes he has significant pain when he tries to move his left shoulder..    Physical Exam  BP 117/83 (BP Location: Right Arm)   Pulse 66   Temp 98.6 F (37 C) (Oral)   Resp 14   SpO2 99%  Patient is awake and alert, no obvious distress.  Speech is not slurred and answers questions appropriately. Mild pain with any attempts at movement of the left shoulder.  He is able to move his left hand and fingers. Sensation to left upper extremity is grossly intact on exam to light touch.  Medical Decision Making  Medically screening exam initiated at 6:08 PM.  Appropriate orders placed.  Rene Kocher was informed that the remainder of the evaluation will be completed by another provider, this initial triage assessment does not replace that evaluation, and the importance of remaining in the ED until their evaluation is complete.  Clinical Impression  Left shoulder pain   Cristina Gong, [place] 02/12/21 1810

## 2021-02-12 NOTE — Discharge Instructions (Addendum)
You were seen today for shoulder pain.  Your x-rays do not show fracture.  You likely have some muscle spasm and strain.  Use ice.  Take medications as prescribed.  Follow-up with orthopedics if not improving.  Avoid heavy lifting until pain has improved.

## 2022-07-14 IMAGING — CR DG SHOULDER 2+V*L*
4 series · 4 of 4 positions shown · non-contrast
Comparison: None.

CLINICAL DATA: 24-year-old male with trauma to the left shoulder.

EXAM:
LEFT SHOULDER - 2+ VIEW

[shoulder grashey]
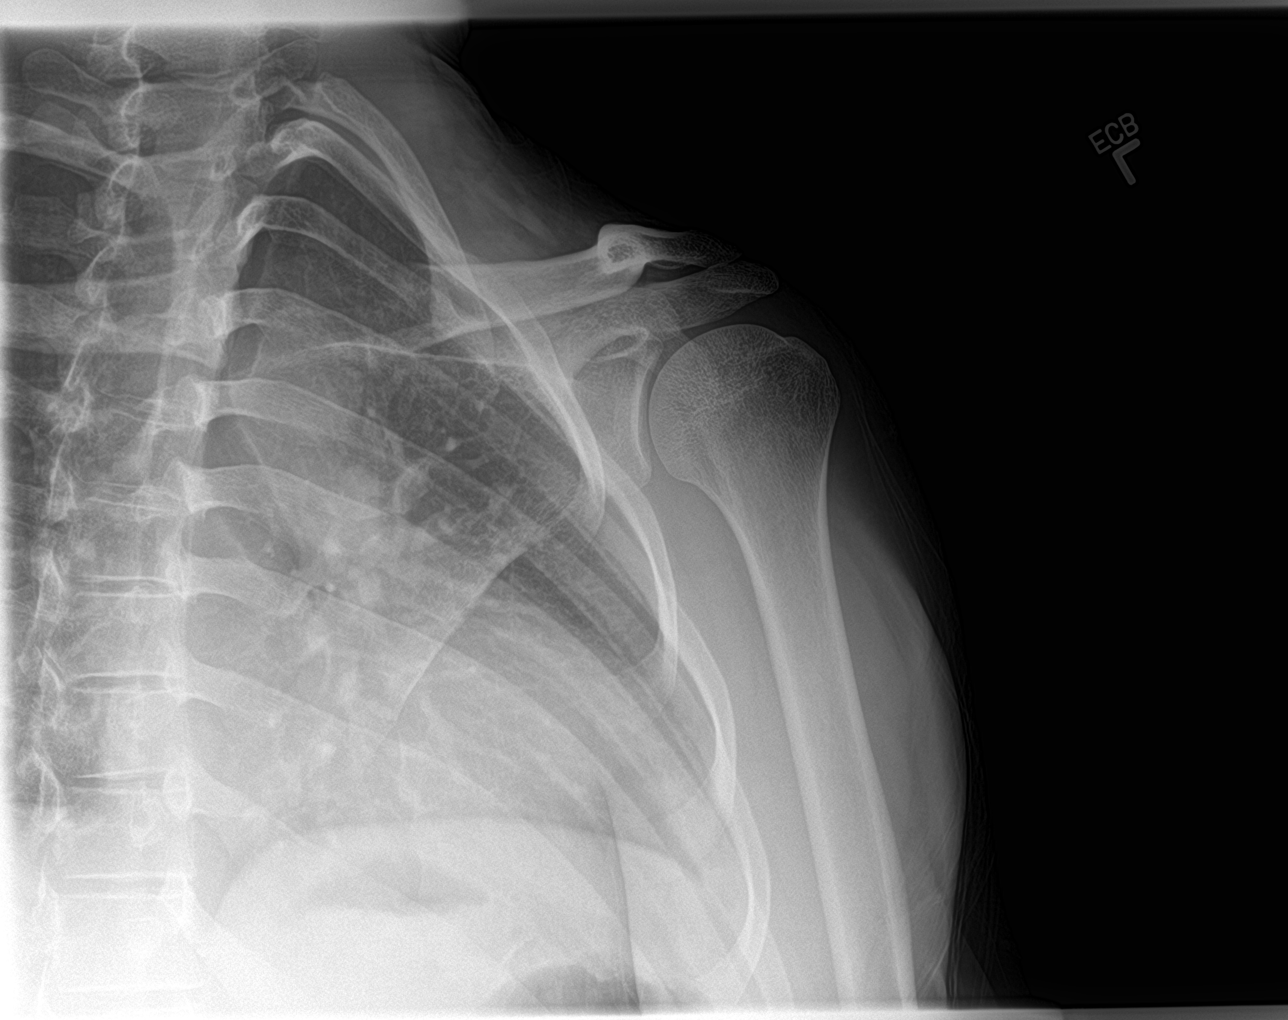

[shoulder y view]
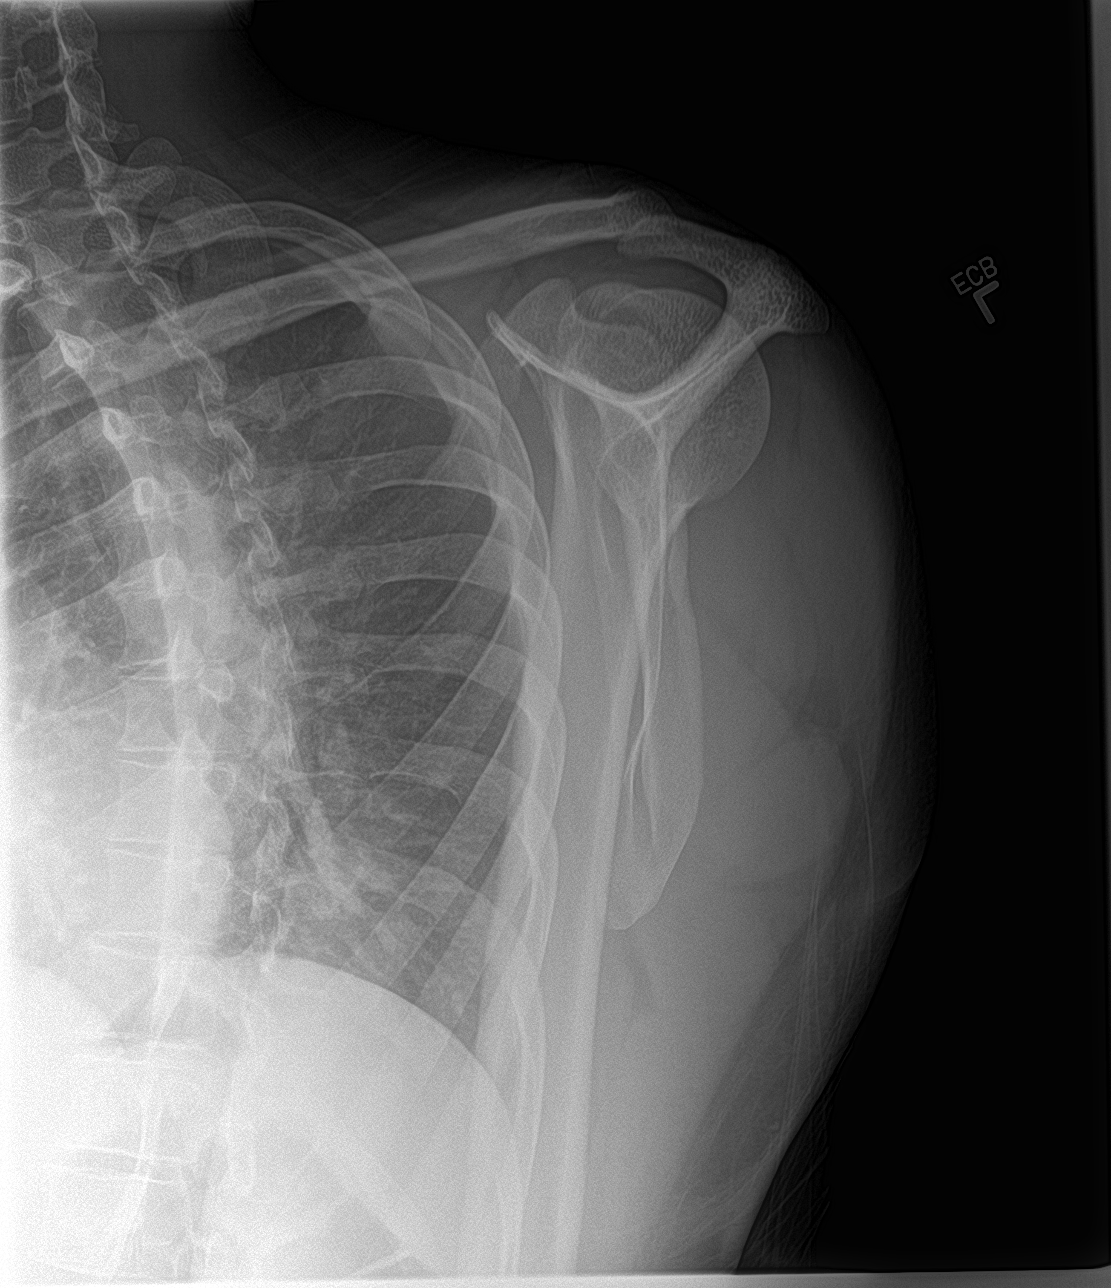

[shoulder axillary]
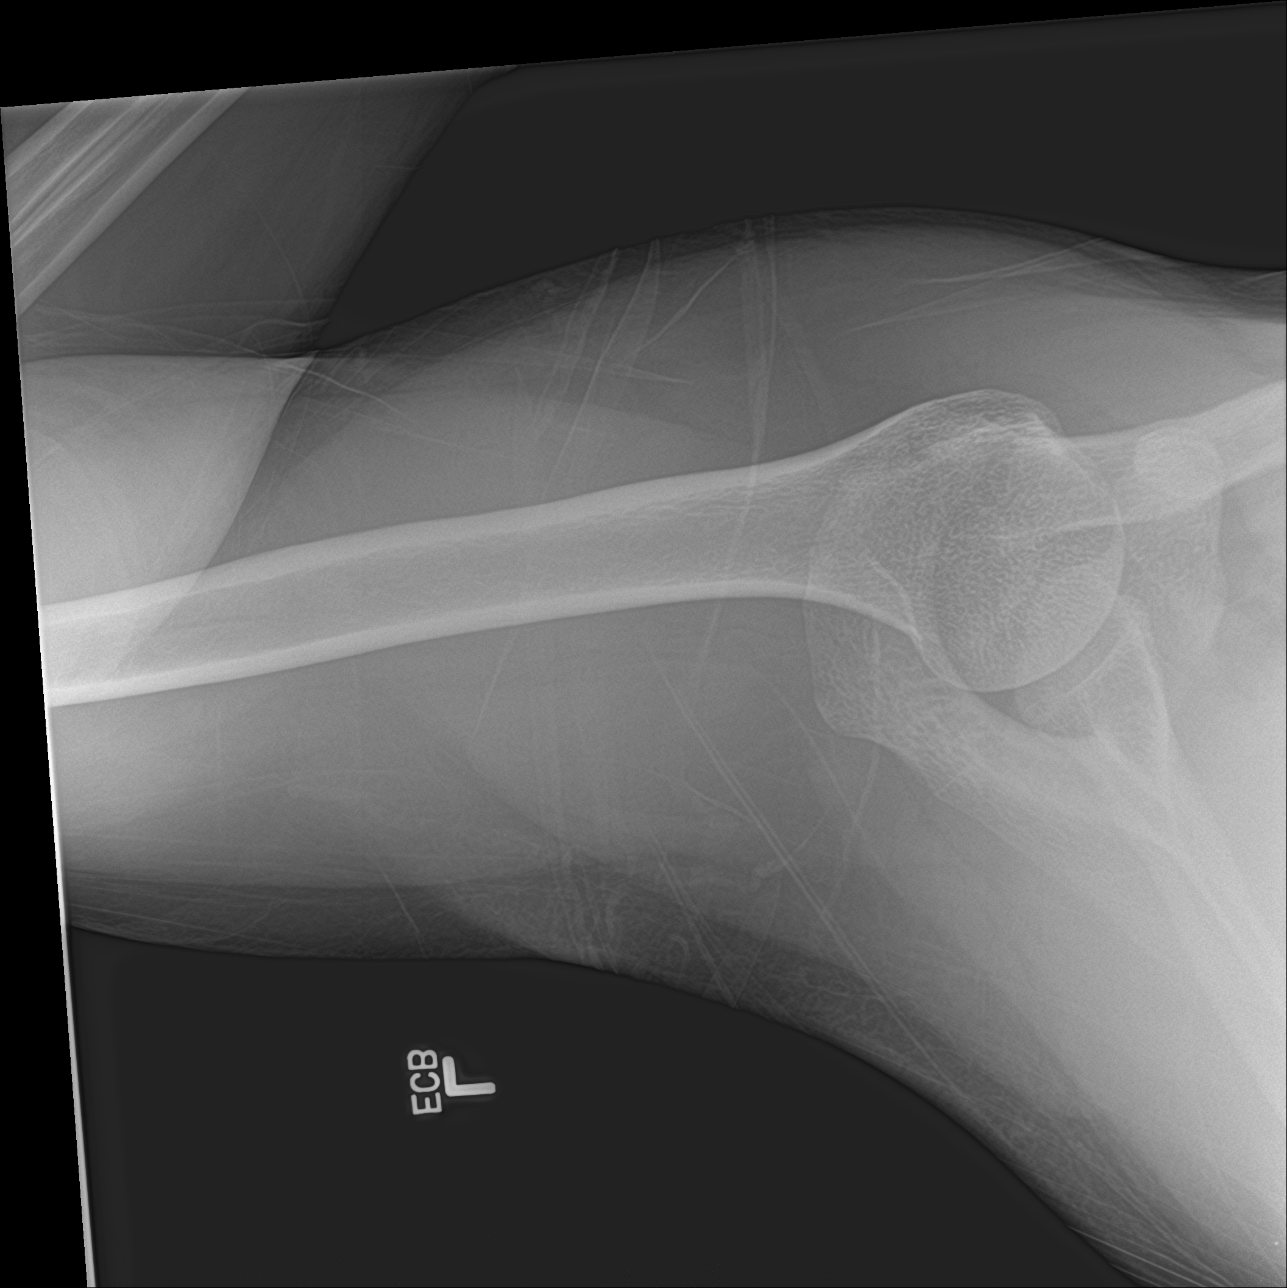

[shoulder ap neutral]
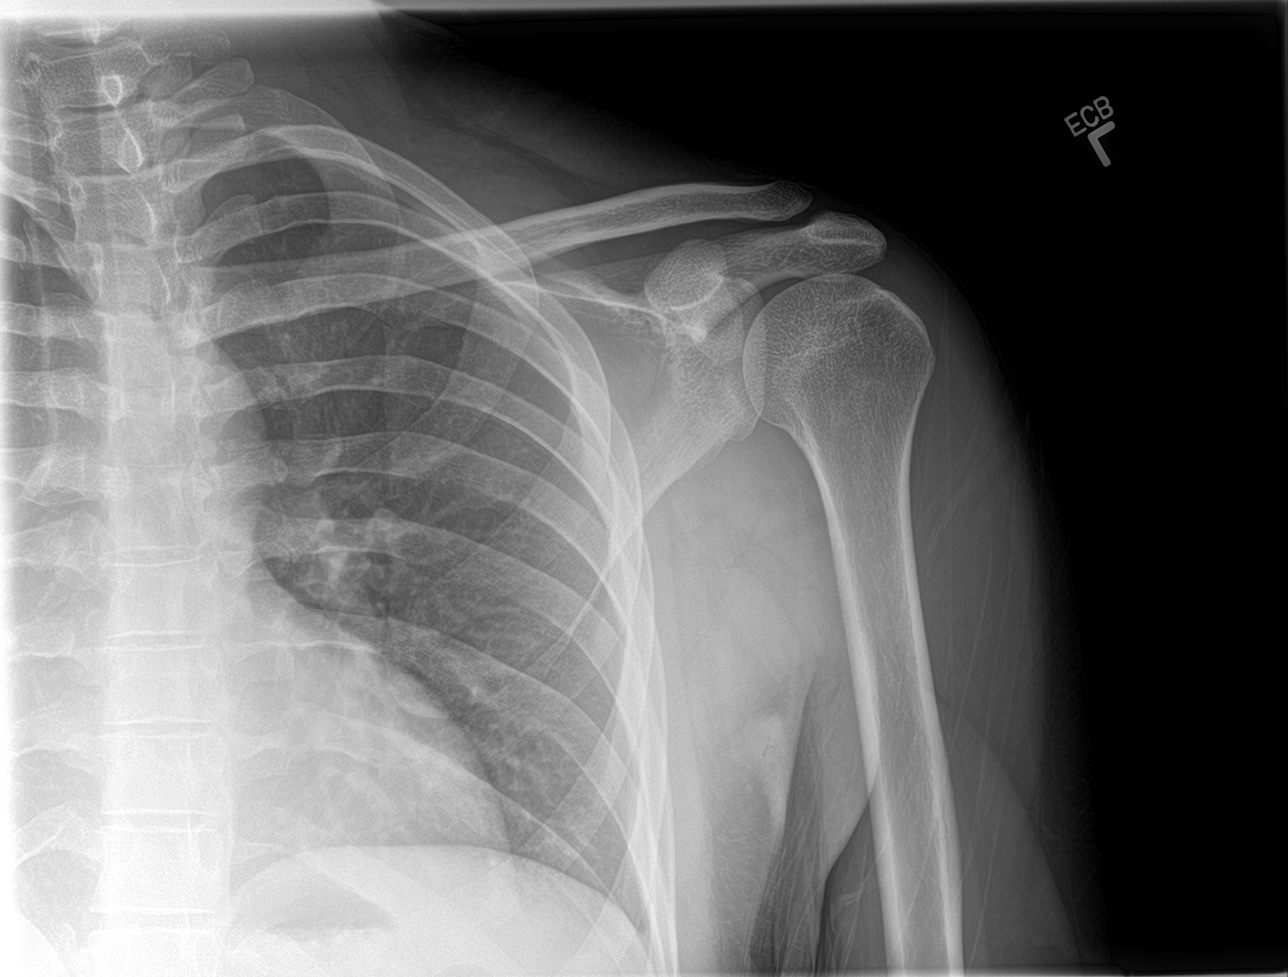

[4 of 4 positions shown; findings below may reference images not displayed]

FINDINGS: There is no evidence of fracture or dislocation. There is no
evidence of arthropathy or other focal bone abnormality. Soft
tissues are unremarkable.
IMPRESSION: Negative.
